# Patient Record
Sex: Male | Born: 1995 | Hispanic: Yes | Marital: Single | Smoking: Never smoker
Health system: Southern US, Community
[De-identification: ages and names within clinical notes are randomized; demographics above are authoritative.]

## PROBLEM LIST (undated history)

## (undated) HISTORY — PX: TONSILLECTOMY AND ADENOIDECTOMY: SUR1326

---
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
## 2019-03-21 ENCOUNTER — Other Ambulatory Visit: Payer: Self-pay

## 2019-03-21 DIAGNOSIS — Z20822 Contact with and (suspected) exposure to covid-19: Secondary | ICD-10-CM

## 2019-03-22 LAB — NOVEL CORONAVIRUS, NAA: SARS-CoV-2, NAA: NOT DETECTED

## 2019-06-08 ENCOUNTER — Encounter: Payer: Self-pay | Admitting: Emergency Medicine

## 2019-06-08 ENCOUNTER — Ambulatory Visit
Admission: EM | Admit: 2019-06-08 | Discharge: 2019-06-08 | Disposition: A | Discharge status: Home / Self Care | Payer: Worker's Compensation | Attending: Family Medicine | Admitting: Family Medicine

## 2019-06-08 ENCOUNTER — Other Ambulatory Visit: Payer: Self-pay

## 2019-06-08 ENCOUNTER — Ambulatory Visit (INDEPENDENT_AMBULATORY_CARE_PROVIDER_SITE_OTHER): Payer: Worker's Compensation

## 2019-06-08 DIAGNOSIS — S9001XA Contusion of right ankle, initial encounter: Secondary | ICD-10-CM

## 2019-06-08 DIAGNOSIS — M25571 Pain in right ankle and joints of right foot: Secondary | ICD-10-CM

## 2019-06-08 DIAGNOSIS — S99911A Unspecified injury of right ankle, initial encounter: Secondary | ICD-10-CM

## 2019-06-08 DIAGNOSIS — W228XXA Striking against or struck by other objects, initial encounter: Secondary | ICD-10-CM | POA: Diagnosis not present

## 2019-06-08 MED ORDER — MELOXICAM 15 MG PO TABS: 15.0000 mg | ORAL_TABLET | Freq: Every day | ORAL | 0 refills | Status: DC | PRN

## 2019-06-08 NOTE — Discharge Instructions (Signed)
Medication as directed.  Continue icing.  Take care  Dr. Lacinda Axon

## 2019-06-08 NOTE — ED Triage Notes (Signed)
Patient states that he was injured at work on Sunday.  Patient states that he went back to work today.  Patient c/o right foot and ankle pain.  Patient states that on Sunday another employee's pallot hit his right ankle.

## 2019-06-08 NOTE — ED Provider Notes (Signed)
[facility]    CSN: [id number] Arrival date & time: 06/08/19  [place]  History   Chief Complaint Chief Complaint  Patient presents with  . Worker's Comp  . Foot Pain    HPI  23 year old male presents with an ankle injury.  Patient states that he works at [employer].  Patient states that a forklift was carrying pallets and he was accidentally hit by one of the pallets.  He was hit on the medial aspect of his right ankle.  Patient states that this occurred on Sunday.  He has been out of work until today.  He returned today and had pain doing his normal job activities.  He rates his pain as 6/10 in severity.  He is essentially asymptomatic until he is active and then he has pain.  No swelling.  He has a small eschar on the medial aspect of his ankle.  No evidence of infection.  No fever.  Patient has been using Tylenol and has rested as well as ice the area with improvement.  However, he was concerned given worsening pain and difficulty doing his job today.  No other complaints.  Hx reviewed as below.  No significant PMH other than what pertains to surgical Hx.  Past Surgical History:  Procedure Laterality Date  . TONSILLECTOMY AND ADENOIDECTOMY     Home Medications    Prior to Admission medications   Medication Sig Start Date End Date Taking? Authorizing Provider  meloxicam (MOBIC) 15 MG tablet Take 1 tablet (15 mg total) by mouth daily as needed for pain. 06/08/19   Coral Spikes, DO    Family History Family History  Problem Relation Age of Onset  . Healthy Mother   . Hypertension Father     Social History Social History   Tobacco Use  . Smoking status: Never Smoker  . Smokeless tobacco: Never Used  Substance Use Topics  . Alcohol use: Yes  . Drug use: Never     Allergies   Patient has no known allergies.   Review of Systems Review of Systems  Constitutional: Negative.   Musculoskeletal:       Ankle pain.   Physical Exam  Triage Vital Signs ED Triage Vitals  Enc Vitals Group     BP 06/08/19 1852 139/67     Pulse --      Resp 06/08/19 1852 16     Temp 06/08/19 1852 98.4 F (36.9 C)     Temp Source 06/08/19 1852 Oral     SpO2 06/08/19 1852 100 %     Weight 06/08/19 1850 180 lb (81.6 kg)     Height 06/08/19 1850 6\' 6"  (1.981 m)     Head Circumference --      Peak Flow --      Pain Score 06/08/19 1849 6     Pain Loc --      Pain Edu? --      Excl. in [place]? --    Updated Vital Signs BP 139/67 (BP Location: Right Arm)   Temp 98.4 F (36.9 C) (Oral)   Resp 16   Ht 6\' 6"  (1.981 m)   Wt 81.6 kg   SpO2 100%   BMI 20.80 kg/m   Visual Acuity Right Eye Distance:   Left Eye Distance:   Bilateral Distance:    Right Eye Near:   Left Eye Near:    Bilateral Near:     Physical Exam Vitals signs and nursing note reviewed.  Constitutional:      General: He is not in acute distress.    Appearance: Normal appearance.  HENT:     Head: Normocephalic and atraumatic.  Eyes:     General:        Right eye: No discharge.        Left eye: No discharge.     Conjunctiva/sclera: Conjunctivae normal.  Pulmonary:     Effort: Pulmonary effort is normal. No respiratory distress.  Musculoskeletal:     Comments: Right ankle -patient with mild tenderness just inferior to the medial malleolus.  Normal range of motion.  Achilles intact.  No apparent swelling.  Skin:    Findings: No bruising.     Comments: Small eschar noted on the medial aspect of his right ankle.  Neurological:     Mental Status: He is alert.  Psychiatric:        Mood and Affect: Mood normal.        Behavior: Behavior normal.    UC Treatments / Results  Labs (all labs ordered are listed, but only abnormal results are displayed) Labs Reviewed - No data to display  EKG   Radiology Dg Ankle Complete Right  Result Date: 06/08/2019 CLINICAL DATA:  Injury, direct medial ankle strike on Sunday, unable to bear weight EXAM: RIGHT ANKLE -  COMPLETE 3+ VIEW COMPARISON:  None. FINDINGS: Minimal medial ankle soft tissue swelling. No joint effusion. No acute bony abnormality. Specifically, no fracture, subluxation, or dislocation. Ankle mortise is congruent. Normal bone mineralization. IMPRESSION: Minimal soft tissue swelling medial ankle. No joint effusion or acute osseous abnormality. Electronically Signed   By: Kreg Shropshire M.D.   On: 06/08/2019 19:31    Procedures Procedures (including critical care time)  Medications Ordered in UC Medications - No data to display  Initial Impression / Assessment and Plan / UC Course  I have reviewed the triage vital signs and the nursing notes.  Pertinent labs & imaging results that were available during my care of the patient were reviewed by me and considered in my medical decision making (see chart for details).    23 year old male presents with ankle injury.  Workmen's Comp. injury.  X-ray negative.  Placing on meloxicam.  Advised rest and ice.  Able to return to work with restrictions.  Forms filled out.  Final Clinical Impressions(s) / UC Diagnoses   Final diagnoses:  Contusion of right ankle, initial encounter  Injury of right ankle, initial encounter     Discharge Instructions     Medication as directed.  Continue icing.  Take care  Dr. Adriana Simas     ED Prescriptions    Medication Sig Dispense Auth. Provider   meloxicam (MOBIC) 15 MG tablet Take 1 tablet (15 mg total) by mouth daily as needed for pain. 14 tablet Tommie Sams, DO     PDMP not reviewed this encounter.   Tommie Sams, [place] 06/08/19 1945

## 2020-07-27 ENCOUNTER — Ambulatory Visit (HOSPITAL_COMMUNITY)
Admission: EM | Admit: 2020-07-27 | Discharge: 2020-07-27 | Disposition: A | Discharge status: Still In Hospital | Payer: Medicaid Other

## 2020-07-27 ENCOUNTER — Other Ambulatory Visit: Payer: Self-pay

## 2020-07-27 ENCOUNTER — Ambulatory Visit
Admission: EM | Admit: 2020-07-27 | Discharge: 2020-07-27 | Disposition: A | Discharge status: Home / Self Care | Payer: Medicaid Other | Attending: Family Medicine | Admitting: Family Medicine

## 2020-07-27 DIAGNOSIS — M545 Low back pain, unspecified: Secondary | ICD-10-CM

## 2020-07-27 MED ORDER — CYCLOBENZAPRINE HCL 10 MG PO TABS
10.0000 mg | ORAL_TABLET | Freq: Two times a day (BID) | ORAL | 0 refills | Status: AC | PRN
Start: 2020-07-27 — End: ?

## 2020-07-27 MED ORDER — PREDNISONE 5 MG PO TABS: ORAL_TABLET | ORAL | 0 refills | Status: DC

## 2020-07-27 NOTE — ED Provider Notes (Signed)
[facility]    CSN: [id number] Arrival date & time: 07/27/20  1555      History   Chief Complaint Chief Complaint  Patient presents with  . Back Pain    X 4 years    HPI Dwayne Green is a 24 y.o. male.   He is presenting with acute on chronic right-sided low back pain.  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  He denies any specific injury or inciting event.  Symptoms are localized to the right lower area.  No history of surgery.  No history of injury.  HPI  History reviewed. No pertinent past medical history.  There are no problems to display for this patient.   Past Surgical History:  Procedure Laterality Date  . TONSILLECTOMY AND ADENOIDECTOMY         Home Medications    Prior to Admission medications   Medication Sig Start Date End Date Taking? Authorizing Provider  cyclobenzaprine (FLEXERIL) 10 MG tablet Take 1 tablet (10 mg total) by mouth 2 (two) times daily as needed for muscle spasms. 07/27/20   Myra Rude, MD  predniSONE (DELTASONE) 5 MG tablet Take 6 pills for first day, 5 pills second day, 4 pills third day, 3 pills fourth day, 2 pills the fifth day, and 1 pill sixth day. 07/27/20   Myra Rude, MD    Family History Family History  Problem Relation Age of Onset  . Healthy Mother   . Hypertension Father     Social History Social History   Tobacco Use  . Smoking status: Never Smoker  . Smokeless tobacco: Never Used  Vaping Use  . Vaping Use: Some days  Substance Use Topics  . Alcohol use: Yes  . Drug use: Never     Allergies   Patient has no known allergies.   Review of Systems Review of Systems  See HPI  Physical Exam Triage Vital Signs ED Triage Vitals  Enc Vitals Group     BP 07/27/20 1635 (!) 146/78     Pulse Rate 07/27/20 1635 (!) 110     Resp 07/27/20 1635 20     Temp 07/27/20 1635 99.2 F (37.3 C)     Temp Source 07/27/20 1635 Oral     SpO2 07/27/20 1635 98 %     Weight  --      Height --      Head Circumference --      Peak Flow --      Pain Score 07/27/20 1646 8     Pain Loc --      Pain Edu? --      Excl. in GC? --    No data found.  Updated Vital Signs BP (!) 146/78 (BP Location: Right Arm)   Pulse (!) 110   Temp 99.2 F (37.3 C) (Oral)   Resp 20   SpO2 98%   Visual Acuity Right Eye Distance:   Left Eye Distance:   Bilateral Distance:    Right Eye Near:   Left Eye Near:    Bilateral Near:     Physical Exam Gen: NAD, alert, cooperative with exam, well-appearing ENT: normal lips, normal nasal mucosa,  Eye: normal EOM, normal conjunctiva and lids Skin: no rashes, no areas of induration  Neuro: normal tone, normal sensation to touch Psych:  normal insight, alert and oriented MSK:  Back:  Normal flexion and extension. No instability 1 leg standing. Some instability with left hip flexion  and abduction. Negative straight leg raise. Neurovascular intact   UC Treatments / Results  Labs (all labs ordered are listed, but only abnormal results are displayed) Labs Reviewed - No data to display  EKG   Radiology No results found.  Procedures Procedures (including critical care time)  Medications Ordered in UC Medications - No data to display  Initial Impression / Assessment and Plan / UC Course  I have reviewed the triage vital signs and the nursing notes.  Pertinent labs & imaging results that were available during my care of the patient were reviewed by me and considered in my medical decision making (see chart for details).     Mr. Eckerman is a 24 year old male that is presenting with acute on chronic right-sided low back pain.  Seems likely related to spasm.  He may have an underlying Marfan's syndrome diagnosis.  Which could be contributing to his symptoms.  Provided prednisone and Flexeril.  Counseled on follow-up.  Final Clinical Impressions(s) / UC Diagnoses   Final diagnoses:  Acute right-sided low back pain without  sciatica     Discharge Instructions     Please try heat  Please try the medicines.  Please follow up if your symptoms fail to improve.     ED Prescriptions    Medication Sig Dispense Auth. Provider   predniSONE (DELTASONE) 5 MG tablet Take 6 pills for first day, 5 pills second day, 4 pills third day, 3 pills fourth day, 2 pills the fifth day, and 1 pill sixth day. 21 tablet Myra Rude, MD   cyclobenzaprine (FLEXERIL) 10 MG tablet Take 1 tablet (10 mg total) by mouth 2 (two) times daily as needed for muscle spasms. 20 tablet Myra Rude, MD     PDMP not reviewed this encounter.   Myra Rude, MD 07/27/20 [phone]

## 2020-07-27 NOTE — Discharge Instructions (Signed)
Please try heat  Please try the medicines.  Please follow up if your symptoms fail to improve.

## 2020-07-27 NOTE — ED Triage Notes (Signed)
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 Pt is aox4 and ambulatory. Pt saw a chiropractor 2 years ago but stopped getting treatment.

## 2020-07-29 ENCOUNTER — Ambulatory Visit (HOSPITAL_BASED_OUTPATIENT_CLINIC_OR_DEPARTMENT_OTHER)
Admission: RE | Admit: 2020-07-29 | Discharge: 2020-07-29 | Disposition: A | Discharge status: Home / Self Care | Payer: Medicaid Other | Source: Ambulatory Visit | Attending: Family Medicine | Admitting: Family Medicine

## 2020-07-29 ENCOUNTER — Ambulatory Visit (INDEPENDENT_AMBULATORY_CARE_PROVIDER_SITE_OTHER): Payer: Medicaid Other | Admitting: Family Medicine

## 2020-07-29 ENCOUNTER — Other Ambulatory Visit: Payer: Self-pay

## 2020-07-29 VITALS — BP 124/70 | Ht 78.0 in | Wt 189.0 lb

## 2020-07-29 DIAGNOSIS — R011 Cardiac murmur, unspecified: Secondary | ICD-10-CM | POA: Diagnosis not present

## 2020-07-29 DIAGNOSIS — M545 Low back pain, unspecified: Secondary | ICD-10-CM | POA: Diagnosis present

## 2020-07-29 DIAGNOSIS — R2991 Unspecified symptoms and signs involving the musculoskeletal system: Secondary | ICD-10-CM

## 2020-07-29 NOTE — Assessment & Plan Note (Addendum)
Acute on chronic in nature. Seems more muscle in nature. Has been started on prednisone and Flexeril. He has normal marfanoid habitus which could be contributing to his ongoing symptoms. He played baseball while growing up in the [place] at [employer]. 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 - counseled on home exercise and supportive care - xray  - provided duexis samples.  - referral to physical therapy

## 2020-07-29 NOTE — Patient Instructions (Addendum)
Good to see you  Please try heat  Please try the duexis  Please try the exercises  Physical therapy will give you call  The eye doctor appointment will be scheduled:  [facility] [address] [place], [address] Tues 09/16/2020 at 2p Phone: [phone]  I will call with the results  Please send me a message in MyChart with any questions or updates.  Please see me back in 4-6 weeks.   --Dr. Jordan Likes

## 2020-07-29 NOTE — Progress Notes (Signed)
Medication Samples have been provided to the patient.  Drug name: duexis      Strength: 800/26.6mg         Qty: 2 bxs  LOT: [id number]  Exp.Date: 12/07/2020  Dosing instructions: take one tab by mouth three time a day  The patient has been instructed regarding the correct time, dose, and frequency of taking this medication, including desired effects and most common side effects.   Dwayne Green 1:47 PM 07/29/2020

## 2020-07-29 NOTE — Progress Notes (Signed)
  Dwayne Green - 24 y.o. male MRN [id number]  Date of birth: [birth year]  SUBJECTIVE:  Including CC & ROS.  No chief complaint on file.   Dwayne Green is a 24 y.o. male that is presenting with acute on chronic right-sided low back pain. Pain is worse when he gets up out of a seated position. Seems localized to the lower back. He has noticed this roughly 4 years now. 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 No radicular symptoms. Has tried some medications with limited improvement. Denies any specific injury or inciting event. Seems to be staying the same.   Review of Systems See HPI   HISTORY: Past Medical, Surgical, Social, and Family History Reviewed & Updated per EMR.   Pertinent Historical Findings include:  No past medical history on file.  Past Surgical History:  Procedure Laterality Date  . TONSILLECTOMY AND ADENOIDECTOMY      Family History  Problem Relation Age of Onset  . Healthy Mother   . Hypertension Father     Social History   Socioeconomic History  . Marital status: Single    Spouse name: Not on file  . Number of children: Not on file  . Years of education: Not on file  . Highest education level: Not on file  Occupational History  . Not on file  Tobacco Use  . Smoking status: Never Smoker  . Smokeless tobacco: Never Used  Vaping Use  . Vaping Use: Some days  Substance and Sexual Activity  . Alcohol use: Yes  . Drug use: Never  . Sexual activity: Yes  Other Topics Concern  . Not on file  Social History Narrative  . Not on file   Social Determinants of Health   Financial Resource Strain: Not on file  Food Insecurity: Not on file  Transportation Needs: Not on file  Physical Activity: Not on file  Stress: Not on file  Social Connections: Not on file  Intimate Partner Violence: Not on file     PHYSICAL EXAM:  VS: BP 124/70   Ht 6\' 6"  (1.981 m)   Wt 189 lb (85.7 kg)   BMI 21.84 kg/m  Physical Exam Gen: NAD, alert,  cooperative with exam, well-appearing MSK:  Normal range of motion in flexion and extension. Normal strength resistance. Positive wrist sign and thumb sign. Neurovascular intact     ASSESSMENT & PLAN:   Acute right-sided low back pain without sciatica Acute on chronic in nature. Seems more muscle in nature. Has been started on prednisone and Flexeril. He has normal marfanoid habitus which could be contributing to his ongoing symptoms. He played baseball while growing up in the at [place]. 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 No other family member is following like he is. -Referral to ophthalmology. -Echo. -Referral to physical therapy.

## 2020-07-29 NOTE — Assessment & Plan Note (Signed)
Has more symptoms suggestive of Marfan's in nature. No other family member is following like he is. -Referral to ophthalmology. -Echo. -Referral to physical therapy.

## 2020-07-30 ENCOUNTER — Telehealth: Payer: Self-pay | Admitting: Family Medicine

## 2020-07-30 NOTE — Telephone Encounter (Signed)
Left VM for patient. If he calls back please have him speak with a nurse/CMA and inform that his xray shows that he has a mild curve to his spine. This is what physical therapy can help with.   If any questions then please take the best time and phone number to call and I will try to call him back.   Myra Rude, MD [facility] 07/30/2020, 8:52 AM

## 2020-07-31 ENCOUNTER — Ambulatory Visit (HOSPITAL_COMMUNITY): Payer: Medicaid Other

## 2020-07-31 NOTE — Addendum Note (Signed)
Addended by: Annita Brod on: 07/31/2020 11:50 AM   Modules accepted: Orders

## 2020-08-19 ENCOUNTER — Ambulatory Visit: Payer: Medicaid Other | Admitting: Cardiology

## 2020-08-19 ENCOUNTER — Ambulatory Visit: Payer: Medicaid Other

## 2020-08-29 ENCOUNTER — Telehealth: Payer: Medicaid Other | Admitting: Family Medicine

## 2020-09-04 ENCOUNTER — Ambulatory Visit: Payer: Medicaid Other

## 2020-09-09 ENCOUNTER — Ambulatory Visit: Payer: Medicaid Other | Admitting: Cardiology

## 2020-09-12 ENCOUNTER — Ambulatory Visit: Payer: Medicaid Other | Admitting: Family Medicine

## 2020-09-15 ENCOUNTER — Ambulatory Visit (INDEPENDENT_AMBULATORY_CARE_PROVIDER_SITE_OTHER): Payer: Medicaid Other | Admitting: Internal Medicine

## 2020-09-15 ENCOUNTER — Other Ambulatory Visit: Payer: Self-pay

## 2020-09-15 VITALS — BP 134/92 | HR 91 | Ht 78.0 in | Wt 190.0 lb

## 2020-09-15 DIAGNOSIS — R079 Chest pain, unspecified: Secondary | ICD-10-CM

## 2020-09-15 DIAGNOSIS — R2991 Unspecified symptoms and signs involving the musculoskeletal system: Secondary | ICD-10-CM

## 2020-09-15 NOTE — Progress Notes (Signed)
Cardiology Office Note:    Date:  09/15/2020   ID:  Dwayne Green, DOB 1996-02-10, MRN [id number]  PCP:  [facility]  [facility] Cardiologist:  Armanda Magic, MD  [facility] Electrophysiologist:  None   CC: Back pain Consulted for the evaluation of heart murmur at the behest of [facility]  History of Present Illness:    Dwayne Green is a 25 y.o. male with a hx of marfanoid habitus who presents for evaluation.  Patient notes that he is feeling OK- but got evaluated for back pain.  Notes that he has sternal chest pain when he breathes in.  Worse with inspiration and goes to his back.    Discomfort occurs with inspiratory only, worsens with breathing, and improves with breathing out.  Patient exertion notable for hiking and exercise with pain after activity but not during. No shortness of breath, DOE.  No PND or orthopnea.  No bendopnea, weight gain, leg swelling , or abdominal swelling.  No syncope or near syncope . Notes  no palpitations or funny heart beats.     Patient reports NO prior cardiac testing including  echo,  stress test,  heart catheterizations,  cardioversion,  ablations.  No past medical history on file.  Past Surgical History:  Procedure Laterality Date  . TONSILLECTOMY AND ADENOIDECTOMY      Current Medications: No outpatient medications have been marked as taking for the 09/15/20 encounter (Office Visit) with Christell Constant, MD.     Allergies:   Patient has no known allergies.   Social History   Socioeconomic History  . Marital status: Single    Spouse name: Not on file  . Number of children: Not on file  . Years of education: Not on file  . Highest education level: Not on file  Occupational History  . Not on file  Tobacco Use  . Smoking status: Never Smoker  . Smokeless tobacco: Never Used  Vaping Use  . Vaping Use: Some days  Substance and Sexual Activity  . Alcohol use: Yes  .  Drug use: Never  . Sexual activity: Yes  Other Topics Concern  . Not on file  Social History Narrative  . Not on file   Social Determinants of Health   Financial Resource Strain: Not on file  Food Insecurity: Not on file  Transportation Needs: Not on file  Physical Activity: Not on file  Stress: Not on file  Social Connections: Not on file     Family History: The patient's family history includes Healthy in his mother; Hypertension in his father. History of coronary artery disease notable for no members. History of heart failure notable for no members. No history of cardiomyopathies including hypertrophic cardiomyopathy, left ventricular non-compaction, or arrhythmogenic right ventricular cardiomyopathy. History of arrhythmia notable for no members. Denies family history of sudden cardiac death including drowning, car accidents, or unexplained deaths in the family. No history of bicuspid aortic valve or aortic aneurysm or dissection. No history of Marfan's  ROS:   Please see the history of present illness.    Notes chronic back pain that kept him from baseball  All other systems reviewed and are negative.  EKGs/Labs/Other Studies Reviewed:    The following studies were reviewed today:  EKG:   09/15/20: SR rate 91 WNL  Recent Labs: No results found for requested labs within last 8760 hours.  Recent Lipid Panel No results found for: CHOL, TRIG, HDL, CHOLHDL, VLDL,  LDLCALC, LDLDIRECT   Risk Assessment/Calculations:     Ghent Criteria/Systemic Score: +2   Physical Exam:    VS:  BP (!) 134/92   Pulse 91   Ht 6\' 6"  (1.981 m)   Wt 190 lb (86.2 kg)   BMI 21.96 kg/m     Arm span: 82 cm Lower segment 125 cm  Wt Readings from Last 3 Encounters:  09/15/20 190 lb (86.2 kg)  07/29/20 189 lb (85.7 kg)  06/08/19 180 lb (81.6 kg)     GEN:  Well nourished, well developed in no acute distress HEENT: Normal, no myopia NECK: No JVD; No carotid bruits LYMPHATICS: No  lymphadenopathy CARDIAC: RRR, no murmurs, rubs, gallops RESPIRATORY:  Clear to auscultation without rales, wheezing or rhonchi  ABDOMEN: Soft, non-tender, non-distended MUSCULOSKELETAL:  No edema; No deformity of hind foot or flat foot, hand sign negative; wrist sign positive, no scoliosis, pes escavatum SKIN: Warm and dry, no skin striae NEUROLOGIC:  Alert and oriented x 3 PSYCHIATRIC:  Normal affect   ASSESSMENT:    1. Chest pain of uncertain etiology   2. Marfanoid habitus    PLAN:    In order of problems listed above:  Atypical Chest Pain Marfanoid Habitus with Ghent Score of 2 - will get CXR PA/Lateral and Echocardiogram  Two to three month follow up unless new symptoms or abnormal test results warranting change in plan  Would be reasonable for  Video Visit Follow up Would be reasonable for  APP Follow up  Medication Adjustments/Labs and Tests Ordered: Current medicines are reviewed at length with the patient today.  Concerns regarding medicines are outlined above.  Orders Placed This Encounter  Procedures  . DG Chest 2 View  . EKG 12-Lead  . ECHOCARDIOGRAM COMPLETE   No orders of the defined types were placed in this encounter.   Patient Instructions  Medication Instructions:  Your physician recommends that you continue on your current medications as directed. Please refer to the Current Medication list given to you today.  *If you need a refill on your cardiac medications before your next appointment, please call your pharmacy*   Lab Work: NONE If you have labs (blood work) drawn today and your tests are completely normal, you will receive your results only by: 06/10/19 MyChart Message (if you have MyChart) OR . A paper copy in the mail If you have any lab test that is abnormal or we need to change your treatment, we will call you to review the results.   Testing/Procedures: Your physician has requested that you have an echocardiogram. Echocardiography is a  painless test that uses sound waves to create images of your heart. It provides your doctor with information about the size and shape of your heart and how well your heart's chambers and valves are working. This procedure takes approximately one hour. There are no restrictions for this procedure.   Your provider recommends you have a CHEST X-RAY. Please proceed to [facility]: Address: [address] Phone: [phone] You do not need an appointment. Business hours are M-F 8:00AM to 5:00PM. There are no restrictions for this test.      Follow-Up: At [facility], you and your health needs are our priority.  As part of our continuing mission to provide you with exceptional heart care, we have created designated Provider Care Teams.  These Care Teams include your primary Cardiologist (physician) and Advanced Practice Providers (APPs -  Physician Assistants and  Nurse Practitioners) who all work together to provide you with the care you need, when you need it.  We recommend signing up for the patient portal called "MyChart".  Sign up information is provided on this After Visit Summary.  MyChart is used to connect with patients for Virtual Visits (Telemedicine).  Patients are able to view lab/test results, encounter notes, upcoming appointments, etc.  Non-urgent messages can be sent to your provider as well.   To learn more about what you can do with MyChart, go to [email or web address].    Your next appointment:   3 month(s)  The format for your next appointment:   Video Visit  Provider:  Izora Ribas, MD         Signed, Christell Constant, MD  09/15/2020 5:26 PM    [facility]

## 2020-09-15 NOTE — Patient Instructions (Signed)
Medication Instructions:  Your physician recommends that you continue on your current medications as directed. Please refer to the Current Medication list given to you today.  *If you need a refill on your cardiac medications before your next appointment, please call your pharmacy*   Lab Work: NONE If you have labs (blood work) drawn today and your tests are completely normal, you will receive your results only by: Marland Kitchen MyChart Message (if you have MyChart) OR . A paper copy in the mail If you have any lab test that is abnormal or we need to change your treatment, we will call you to review the results.   Testing/Procedures: Your physician has requested that you have an echocardiogram. Echocardiography is a painless test that uses sound waves to create images of your heart. It provides your doctor with information about the size and shape of your heart and how well your heart's chambers and valves are working. This procedure takes approximately one hour. There are no restrictions for this procedure.   Your provider recommends you have a CHEST X-RAY. Please proceed to [facility] at [facility]: Address: [address] Phone: [phone] You do not need an appointment. Business hours are M-F 8:00AM to 5:00PM. There are no restrictions for this test.      Follow-Up: At [facility], you and your health needs are our priority.  As part of our continuing mission to provide you with exceptional heart care, we have created designated Provider Care Teams.  These Care Teams include your primary Cardiologist (physician) and Advanced Practice Providers (APPs -  Physician Assistants and Nurse Practitioners) who all work together to provide you with the care you need, when you need it.  We recommend signing up for the patient portal called "MyChart".  Sign up information is provided on this After Visit Summary.  MyChart is used to connect with patients for Virtual  Visits (Telemedicine).  Patients are able to view lab/test results, encounter notes, upcoming appointments, etc.  Non-urgent messages can be sent to your provider as well.   To learn more about what you can do with MyChart, go to [email or web address].    Your next appointment:   3 month(s)  The format for your next appointment:   Video Visit  Provider:  Izora Ribas, MD

## 2020-09-16 ENCOUNTER — Ambulatory Visit: Payer: Medicaid Other | Attending: Family Medicine

## 2020-09-16 DIAGNOSIS — R293 Abnormal posture: Secondary | ICD-10-CM | POA: Diagnosis present

## 2020-09-16 DIAGNOSIS — M62830 Muscle spasm of back: Secondary | ICD-10-CM | POA: Diagnosis present

## 2020-09-16 DIAGNOSIS — R2689 Other abnormalities of gait and mobility: Secondary | ICD-10-CM | POA: Diagnosis present

## 2020-09-16 DIAGNOSIS — M545 Low back pain, unspecified: Secondary | ICD-10-CM | POA: Insufficient documentation

## 2020-09-16 DIAGNOSIS — G8929 Other chronic pain: Secondary | ICD-10-CM | POA: Insufficient documentation

## 2020-09-17 NOTE — Therapy (Addendum)
[facility] [address] [place], [place], [address] Phone: [phone]   Fax:  [phone]  Physical Therapy Evaluation  Patient Details  Name: Dwayne Green MRN: [id number] Date of Birth: 06/07/1996 Referring Provider (PT): Myra Rude, MD   Encounter Date: 09/16/2020   PT End of Session - 09/16/20 1705    Visit Number 1    Number of Visits 9    Date for PT Re-Evaluation 11/15/20    Authorization Type [place] [employer] MCD    PT Start Time 1705    PT Stop Time 1748    PT Time Calculation (min) 43 min    Activity Tolerance Patient tolerated treatment well    Behavior During Therapy [facility] for tasks assessed/performed           History reviewed. No pertinent past medical history.  Past Surgical History:  Procedure Laterality Date  . TONSILLECTOMY AND ADENOIDECTOMY      There were no vitals filed for this visit.    Subjective Assessment - 09/16/20 1708    Subjective "I played baseball as a pitcher and went to the [place] to train and get signed. One day (about 5 years ago) my back started like crazy, and they said it might have been from me growing so far or maybe from lifting. 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 It's not as back as it was, but my back always hurts."    Limitations Sitting;Standing;Walking;House hold activities    How long can you sit comfortably? 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 "Probably 30-45 minutes"    How long can you walk comfortably? "Probably 30-45 minutes" then sit for a break if I'm walking long distances    Diagnostic tests 07/29/2020 Lumbar spine radiograph: No acute osseous abnormality in the lumbar spine. Mild levocurvature of the thoracolumbar junction    Patient Stated Goals "I want to  just be back in the gym. I lost a lot of weight because the back limits me."    Currently in Pain? Yes    Pain Score 3     Pain Location Back    Pain Orientation Left;Lower    Pain Descriptors / Indicators Pressure    Pain Type Chronic pain    Pain Radiating Towards Radicular pain to each LE (not typically both at the same time but has experienced it in right and left LE)    Pain Onset More than a month ago    Pain Frequency Intermittent    Aggravating Factors  Prolonged positions (walking, standing, sitting), lying down (unable to lie down on back). 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[employer])    Leisure Working out as able, hang out with family, go out to eat      Cognition   Overall Cognitive Status Within Functional Limits for tasks assessed      Observation/Other Assessments   Focus on Therapeutic Outcomes (FOTO)  No FOTO - MCD      Sensation   Additional Comments Increased tingling along R lateral distal thigh      AROM   Lumbar Flexion WFL - mid LBP; fingertips to mid shin    Lumbar Extension WFL - mid LBP    Lumbar - Right Side Bend WFL    Lumbar - Left Side Bend WFL - sharp pain in R low back    Lumbar - Right Rotation WFL no pain    Lumbar - Left Rotation WFL no pain      Strength   Right Hip Flexion 4-/5    Right Hip ABduction 4/5    Right Hip ADduction 4/5    Left Hip Flexion 4-/5    Left Hip ABduction 4/5    Left Hip ADduction 4/5    Right Knee Flexion 4/5    Right Knee Extension 4/5    Left Knee Flexion 4/5    Left Knee Extension 4/5    Right Ankle Dorsiflexion 5/5    Right Ankle Plantar Flexion 5/5   modified test in sitting   Left Ankle Dorsiflexion 5/5    Left Ankle Plantar Flexion 5/5   modified test in sitting     Palpation   Palpation comment TTP along R lower thoracic and lumbar paraspinals and QL                      Objective measurements completed on examination: See above findings.       [facility] Adult PT Treatment/Exercise - 09/17/20 0001      Exercises   Exercises Lumbar      Lumbar Exercises: Stretches   Single Knee to Chest Stretch Right;Left;1 rep;30 seconds    Lower Trunk Rotation 5 reps    Lower Trunk Rotation Limitations 5x each direction    Piriformis Stretch Right;Left;1 rep;30 seconds    Other Lumbar Stretch Exercise Child's pose forward and with lateral bias x 20 sec each                  PT Education - 09/17/20  1207    Education Details HEP, anatomy of condition, posture and use of lumbar  roll when sitting (especially while truck driving), performing stretches before and after shifts as able, POC    Person(s) Educated Patient    Methods Explanation;Demonstration;Tactile cues;Verbal cues;Handout    Comprehension Verbalized understanding;Returned demonstration            PT Short Term Goals - 09/17/20 1407      PT SHORT TERM GOAL #1   Title Pt will be independent with initial HEP.    Baseline Provided initial HEP 09/16/2020.    Time 4    Period Weeks    Status New    Target Date 10/15/20      PT SHORT TERM GOAL #2   Title Pt will increase bilateral hip FL to at least 4/5 upon MMT reassessment.    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    Baseline Pt has not recently exercised in gym but wishes to return to workout routine and lifting without back pain    Time 4    Period Weeks    Status New    Target Date 10/15/20             PT Long Term Goals - 09/17/20 1412      PT LONG TERM GOAL #1   Title Pt will be independent with advanced HEP.    Baseline Pt provided initial HEP at evaluation 09/16/2020    Time 8    Period Weeks    Status New    Target Date 11/12/20      PT LONG TERM GOAL #2   Title Pt will increase BLE MMT to 5/5 grossly    Baseline 4/5 grossly with 4-/5 B hip FL (see flowsheet)    Time 8    Period Weeks    Status New    Target Date 11/12/20      PT LONG TERM GOAL #3   Title Pt will  report equal sensation with light touch reassessment along bilateral lateral femoral cutaneous nerve distribution/lateral thigh    Baseline Numbness and decreased sensation in lateral R thigh    Time 8    Period Weeks    Status New    Target Date 11/12/20      PT LONG TERM GOAL #4   Title Patient will be able to tolerate working his regular amount of truckdriving shifts with </= 2/10 LBP throughout work day    Baseline Discomfort after 30-45 minutes and currently decreased frequency of shifts due to symptom exacerbation    Time 8    Period Weeks    Status New    Target Date 11/12/20  Plan - 09/17/20 1352    Clinical Impression Statement Patient is a 25 year old male who presents to OPPT with complaints of right-sided LBP with occasional radicular symptoms that he has experienced in each LE. He denies radicular symptoms during evaluation but has diminished sensation and "numbness" along R lateral femoral cutaneous nerve distribution with light touch assessment. He has TTP along R lower thoracic and lumbar paraspinals and quadratus lumborum. He has decreased BLE strength with greatest deficits noted with resisted hip FL bilaterally. Imaging 07/29/2020 revealed mild levocurvature of TLJ and minimal retrolisthesis of L4-L5. He presents with decreased hamstring flexibility bilaterally and TTP along lumbar SP with PAIVM assessment. He should benefit from skilled PT intervention to address deficits and improve postural awareness, especially while performing household activities and while truckdriving.    Personal Factors and Comorbidities Profession;Past/Current Experience;Time since onset of injury/illness/exacerbation    Examination-Activity Limitations Sit;Sleep;Squat;Stairs;Stand;Locomotion Level;Lift;Bend;Carry    Examination-Participation Restrictions Cleaning;Community Activity;Driving;Meal Prep;Occupation;Shop    Stability/Clinical Decision Making Evolving/Moderate  complexity    Clinical Decision Making Moderate    Rehab Potential Good    PT Frequency 1x / week    PT Duration 8 weeks    PT Treatment/Interventions ADLs/Self Care Home Management;Aquatic Therapy;Electrical Stimulation;Cryotherapy;Iontophoresis 4mg /ml Dexamethasone;Moist Heat;Traction;Neuromuscular re-education;Balance training;Therapeutic exercise;Therapeutic activities;Functional mobility training;Stair training;Gait training;Patient/family education;Manual techniques;Energy conservation;Dry needling;Passive range of motion;Taping    PT Next Visit Plan Further assess TLJ/lumbar spine; stretch and strengthen to address tightness in R paraspinals and QL from mild levocurvature, core/trunk/hip strengthening. Body mechanics and postural awareness    PT Home Exercise Plan KB[employer]HVLLD - SKTC, LTR, child's pose, piriformis stretch    Consulted and Agree with Plan of Care Patient           Patient will benefit from skilled therapeutic intervention in order to improve the following deficits and impairments:  Decreased activity tolerance,Decreased strength,Decreased endurance,Decreased range of motion,Difficulty walking,Increased muscle spasms,Postural dysfunction,Improper body mechanics,Pain,Impaired sensation,Decreased mobility  Visit Diagnosis: Chronic right-sided low back pain, unspecified whether sciatica present  Muscle spasm of back  Abnormal posture  Other abnormalities of gait and mobility     Problem List Patient Active Problem List   Diagnosis Date Noted  . Chest pain of uncertain etiology 09/15/2020  . Acute right-sided low back pain without sciatica 07/29/2020  . 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    Check all possible CPT codes: 42683- Therapeutic Exercise, [phone]- Neuro Re-education, [phone] - Gait Training, [phone] - Manual Therapy, 97530 - Therapeutic Activities, [phone] - Self Care, [id number] - Mechanical traction, 97014 - Electrical stimulation (unattended), [id number] - Electrical  stimulation (Manual), [id number] - Iontophoresis, [id number] - Ultrasound, [id number] - Physical performance training and [id number] - Aquatic therapy         Rhea Bleacher, PT, DPT 09/17/20 2:18 PM  [facility] [address] [place], [place], [address] Phone: [phone]   Fax:  [phone]  Name: Dwayne Green MRN: [id number] Date of Birth: [birth year]

## 2020-09-24 ENCOUNTER — Ambulatory Visit: Payer: Medicaid Other

## 2020-09-24 ENCOUNTER — Other Ambulatory Visit: Payer: Self-pay

## 2020-09-24 DIAGNOSIS — R293 Abnormal posture: Secondary | ICD-10-CM

## 2020-09-24 DIAGNOSIS — M62830 Muscle spasm of back: Secondary | ICD-10-CM

## 2020-09-24 DIAGNOSIS — M545 Low back pain, unspecified: Secondary | ICD-10-CM

## 2020-09-24 DIAGNOSIS — R2689 Other abnormalities of gait and mobility: Secondary | ICD-10-CM

## 2020-09-24 DIAGNOSIS — G8929 Other chronic pain: Secondary | ICD-10-CM

## 2020-09-24 NOTE — Therapy (Addendum)
[place], [place], [address] Phone: [phone]   Fax:  [phone]  Physical Therapy Treatment/Discharge  Patient Details  Name: Dwayne Green MRN: [id number] Date of Birth: 12-01-1995 Referring Provider (PT): Rosemarie Ax, MD   Encounter Date: 09/24/2020   PT End of Session - 09/24/20 1704    Visit Number 2    Number of Visits 9    Date for PT Re-Evaluation 11/15/20    Authorization Type [place] [employer] MCD    Authorization - Visit Number 2    Authorization - Number of Visits 27    PT Start Time 3704   pt arrived late   PT Stop Time 1744    PT Time Calculation (min) 40 min    Activity Tolerance Patient tolerated treatment well    Behavior During Therapy [facility] for tasks assessed/performed           History reviewed. No pertinent past medical history.  Past Surgical History:  Procedure Laterality Date  . TONSILLECTOMY AND ADENOIDECTOMY      There were no vitals filed for this visit.   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 He states that he was able to use lumbar support that was in the truck to help him maintain optimal posture while driving. He explains having some occasional stiffness but denies any significant pain.    Limitations Sitting;Standing;Walking;House hold activities    How long can you sit comfortably? 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 "Probably 30-45 minutes"    How long can you walk comfortably? "Probably 30-45 minutes" then sit for a break if I'm walking long distances    Diagnostic tests 07/29/2020 Lumbar spine radiograph: No acute osseous abnormality in the lumbar spine. Mild levocurvature of the thoracolumbar  junction    Patient Stated Goals "I want to just be back in the gym. I lost a lot of weight because the back limits me."    Currently in Pain? 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 Adult PT Treatment/Exercise - 09/24/20 0001      Self-Care   Self-Care Other Self-Care Comments    Other Self-Care Comments  See patient education      Lumbar Exercises: Stretches   Other Lumbar Stretch Exercise Doorknob rhomboid stretch x 45 sec      Lumbar Exercises: Aerobic   Nustep L9 x 5 min LE only      Lumbar Exercises: Standing   Other Standing Lumbar Exercises Hip hinge with dowel x 15      Lumbar Exercises: Supine   Dead Bug 15 reps    Dead Bug Limitations LE 90/90 tapping down x 5 each and alternating EXT x  10 each      Lumbar Exercises: Quadruped   Madcat/Old Horse 10 reps    Madcat/Old Horse Limitations 10x each direction    Other Quadruped Lumbar Exercises Bird dog LE only x 15 each LE - attempted alternating UE but decreased control and ability to maintain core activation at this time    Other Quadruped Lumbar Exercises Child's pose 10 x 5 sec      Manual Therapy   Manual Therapy Soft tissue mobilization;Myofascial release    Manual therapy comments Skilled inspection, palpation, and observation during TPDN of R mid/lower thoracic paraspinals by Carolyne Littles.    Soft tissue mobilization STM/MFR along R mid/lower thoracic paraspinals            Trigger Point Dry Needling - 09/24/20 0001    Consent Given? Yes    Education Handout Provided --   Verbal education provided   Muscles Treated Back/Hip Erector spinae    Dry Needling Comments Lower thoracic paraspinals    Erector  spinae Response Twitch response elicited                PT Education - 09/24/20 1951    Education Details Updated HEP, expected response following TPDN and to continue light stretching and activities as tolerated    Person(s) Educated Patient    Methods Explanation;Demonstration;Tactile cues;Verbal cues;Handout    Comprehension Verbalized understanding;Returned demonstration            PT Short Term Goals - 09/17/20 1407      PT SHORT TERM GOAL #1   Title Pt will be independent with initial HEP.    Baseline Provided initial HEP 09/16/2020.    Time 4    Period Weeks    Status New    Target Date 10/15/20      PT SHORT TERM GOAL #2   Title Pt will increase bilateral hip FL to at least 4/5 upon MMT reassessment.    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    Baseline Pt has not recently exercised in gym but wishes to return to workout routine and lifting without back pain    Time 4    Period Weeks    Status New    Target Date 10/15/20             PT Long Term Goals - 09/17/20 1412      PT LONG TERM GOAL #1   Title Pt will be independent with advanced HEP.    Baseline Pt provided initial HEP at evaluation 09/16/2020    Time 8    Period Weeks    Status New    Target Date 11/12/20      PT LONG TERM GOAL #2   Title Pt  will increase BLE MMT to 5/5 grossly    Baseline 4/5 grossly with 4-/5 B hip FL (see flowsheet)     Time 8    Period Weeks    Status New    Target Date 11/12/20      PT LONG TERM GOAL #3   Title Pt will report equal sensation with light touch reassessment along bilateral lateral femoral cutaneous nerve distribution/lateral thigh    Baseline Numbness and decreased sensation in lateral R thigh    Time 8    Period Weeks    Status New    Target Date 11/12/20      PT LONG TERM GOAL #4   Title 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 Continued with stretching and initiated core strengthening interventions following TPDN with good tolerance. He wishes to return to performing deadlifts, squats, and other activities at the gym but is hesitant due to his back pain - plan to assess body mechanics and progress tolerance. Pt should benefit from continued TPDN with potential electrical stimulation to address tightness in R thoracic paraspinals as well as continued core strengthening and progression towards return to lifting and desired gym activities. Pt denied increased pain or radicular symptoms this session with exception of symptoms as expected during TPDN.    Personal Factors and Comorbidities Profession;Past/Current Experience;Time since onset of injury/illness/exacerbation    Examination-Activity Limitations Sit;Sleep;Squat;Stairs;Stand;Locomotion Level;Lift;Bend;Carry    Examination-Participation Restrictions Cleaning;Community Activity;Driving;Meal Prep;Occupation;Shop    Stability/Clinical Decision Making Evolving/Moderate complexity    Rehab Potential  Good    PT Frequency 1x / week    PT Duration 8 weeks    PT Treatment/Interventions ADLs/Self Care Home Management;Aquatic Therapy;Electrical Stimulation;Cryotherapy;Iontophoresis 56m/ml Dexamethasone;Moist Heat;Traction;Neuromuscular re-education;Balance training;Therapeutic exercise;Therapeutic activities;Functional mobility training;Stair training;Gait training;Patient/family education;Manual techniques;Energy conservation;Dry needling;Passive range of motion;Taping    PT Next Visit Plan TPDN as indicated. Suggest pillow under L side when L sidelying to see if there's improvement in R LBP. Stretch and strengthen to address tightness in R paraspinals and QL from mild levocurvature, core/trunk/hip strengthening. Body mechanics and postural awareness. Deadlifts, squats/goblet squats.    PT HPine Flat LTR, child's pose, piriformis stretch    Consulted and Agree with Plan of Care Patient           Patient will benefit from skilled therapeutic intervention in order to improve the following deficits and impairments:  Decreased activity tolerance,Decreased strength,Decreased endurance,Decreased range of motion,Difficulty walking,Increased muscle spasms,Postural dysfunction,Improper body mechanics,Pain,Impaired sensation,Decreased mobility  Visit Diagnosis: Chronic right-sided low back pain, unspecified whether sciatica present  Muscle spasm of back  Abnormal posture  Other abnormalities of gait and mobility     Problem List Patient Active Problem List   Diagnosis Date Noted  . Chest pain of uncertain etiology 057/97/2820 . Acute right-sided low back pain without sciatica 07/29/2020  . 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  Patient goals were not met. Patient is being discharged due to not returning since the last visit.  ?????          Haydee Monica, PT, DPT 10/15/20 6:08 PM    [facility] [address] [place], [place], [address] Phone: [phone]   Fax:  [phone]  Name: Dwayne Green MRN: [id number] Date of Birth: 1995/09/25

## 2020-10-02 ENCOUNTER — Ambulatory Visit: Payer: Medicaid Other

## 2020-10-02 ENCOUNTER — Telehealth: Payer: Self-pay

## 2020-10-02 NOTE — Telephone Encounter (Signed)
Spoke c pt. He reports forgetting about today's appt since it was on a day he does not usually attend PT. Reminded pt of upcoming appt on 10/09/20.

## 2020-10-03 ENCOUNTER — Other Ambulatory Visit: Payer: Self-pay

## 2020-10-03 ENCOUNTER — Ambulatory Visit (HOSPITAL_COMMUNITY): Payer: Medicaid Other

## 2020-10-09 ENCOUNTER — Telehealth: Payer: Self-pay

## 2020-10-09 ENCOUNTER — Ambulatory Visit: Payer: Medicaid Other

## 2020-10-09 NOTE — Telephone Encounter (Signed)
Left voicemail regarding patient's 2nd no show and reiterated attendance policy. Informed patient that he would be able to schedule 1 appointment at a time if he comes in for next appointment. Confirmed next appointment date and time. Provided clinic phone number and requested that patient call to cancel/reschedule his next appointment.  Rhea Bleacher, PT, DPT 10/09/20 5:40 PM

## 2020-10-15 ENCOUNTER — Telehealth: Payer: Self-pay

## 2020-10-15 ENCOUNTER — Ambulatory Visit: Payer: Medicaid Other

## 2020-10-15 NOTE — Telephone Encounter (Signed)
PT called and left voicemail regarding patient's 3rd no show today and informed him that he will now be D/C per attendance policy. Informed pt that he will require new PT referral if he decides to return in the future.  Rhea Bleacher, PT, DPT 10/15/20 6:06 PM

## 2020-10-29 ENCOUNTER — Encounter (HOSPITAL_COMMUNITY): Payer: Self-pay

## 2020-10-29 ENCOUNTER — Encounter (HOSPITAL_COMMUNITY): Payer: Self-pay | Admitting: Internal Medicine

## 2020-10-29 ENCOUNTER — Other Ambulatory Visit (HOSPITAL_COMMUNITY): Payer: Medicaid Other

## 2020-11-12 ENCOUNTER — Telehealth (HOSPITAL_COMMUNITY): Payer: Self-pay | Admitting: Internal Medicine

## 2020-11-12 NOTE — Telephone Encounter (Signed)
Just an FYI. We have made several attempts to contact this patient including sending a letter to schedule or reschedule their echocardiogram. We will be removing the patient from the echo WQ.   10/29/20 NO SHOWED- MAILED LETTER [facility]    Thank you 

## 2020-12-25 ENCOUNTER — Other Ambulatory Visit: Payer: Self-pay

## 2020-12-25 ENCOUNTER — Telehealth: Payer: Medicaid Other | Admitting: Internal Medicine

## 2021-06-20 ENCOUNTER — Emergency Department (HOSPITAL_BASED_OUTPATIENT_CLINIC_OR_DEPARTMENT_OTHER)
Admission: EM | Admit: 2021-06-20 | Discharge: 2021-06-20 | Disposition: A | Discharge status: Home / Self Care | Payer: Medicaid Other | Attending: Emergency Medicine | Admitting: Emergency Medicine

## 2021-06-20 ENCOUNTER — Encounter (HOSPITAL_BASED_OUTPATIENT_CLINIC_OR_DEPARTMENT_OTHER): Payer: Self-pay | Admitting: Emergency Medicine

## 2021-06-20 ENCOUNTER — Other Ambulatory Visit: Payer: Self-pay

## 2021-06-20 DIAGNOSIS — Z20822 Contact with and (suspected) exposure to covid-19: Secondary | ICD-10-CM | POA: Insufficient documentation

## 2021-06-20 DIAGNOSIS — J101 Influenza due to other identified influenza virus with other respiratory manifestations: Secondary | ICD-10-CM | POA: Diagnosis not present

## 2021-06-20 DIAGNOSIS — R509 Fever, unspecified: Secondary | ICD-10-CM | POA: Diagnosis present

## 2021-06-20 LAB — RESP PANEL BY RT-PCR (FLU A&B, COVID) ARPGX2
Influenza A by PCR: POSITIVE — AB
Influenza B by PCR: NEGATIVE
SARS Coronavirus 2 by RT PCR: NEGATIVE

## 2021-06-20 NOTE — ED Triage Notes (Signed)
Pt presents to ED POV. Pt c/o cough, rhinorrhea, and fever since wed. Pt reports no ibuprofen since yesterday.

## 2021-06-20 NOTE — ED Notes (Signed)
Pt reports fever, cough, fatigue. Pt has tried Nyquil and theraflu without relief. Pt has not tried ibuprofen or tylenol for fever. Other household members with similar symptoms.

## 2021-06-20 NOTE — ED Provider Notes (Signed)
[facility] EMERGENCY DEPT Provider Note   CSN: [id number] Arrival date & time: 06/20/21  1923     History Chief Complaint  Patient presents with   Fever    Dwayne Green is a 25 y.o. male.  The history is provided by the patient. No language interpreter was used.  Fever Max temp prior to arrival:  101 Temp source:  Oral Severity:  Moderate Duration:  3 days Progression:  Worsening Chronicity:  New Relieved by:  Nothing Worsened by:  Nothing Associated symptoms: cough   Risk factors: sick contacts       History reviewed. No pertinent past medical history.  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 Authorizing Provider  cyclobenzaprine (FLEXERIL) 10 MG tablet Take 1 tablet (10 mg total) by mouth 2 (two) times daily as needed for muscle spasms. 07/27/20   Myra Rude, MD    Allergies    Patient has no known allergies.  Review of Systems   Review of Systems  Constitutional:  Positive for fever.  Respiratory:  Positive for cough.   All other systems reviewed and are negative.  Physical Exam Updated Vital Signs BP 138/76 (BP Location: Right Arm)   Pulse (!) 103   Temp 99.9 F (37.7 C) (Oral)   Resp 18   Ht 6\' 6"  (1.981 m)   Wt 84.8 kg   SpO2 99%   BMI 21.61 kg/m   Physical Exam Vitals and nursing note reviewed.   Constitutional:      Appearance: He is well-developed.  HENT:     Head: Normocephalic and atraumatic.  Eyes:     Conjunctiva/sclera: Conjunctivae normal.  Cardiovascular:     Rate and Rhythm: Normal rate and regular rhythm.     Heart sounds: No murmur heard. Pulmonary:     Effort: Pulmonary effort is normal. No respiratory distress.     Breath sounds: Normal breath sounds.  Abdominal:     Palpations: Abdomen is soft.     Tenderness: There is no abdominal tenderness.  Musculoskeletal:     Cervical back: Neck supple.  Skin:    General: Skin is warm and dry.  Neurological:     Mental Status: He is alert.    ED Results / Procedures / Treatments   Labs (all labs ordered are listed, but only abnormal results are displayed) Labs Reviewed  RESP PANEL BY RT-PCR (FLU A&B, COVID) ARPGX2 - Abnormal; Notable for the following components:      Result Value   Influenza A by PCR POSITIVE (*)    All other components within normal limits    EKG None  Radiology No results found.  Procedures Procedures   Medications Ordered in ED Medications - No data to display  ED Course  I  have reviewed the triage vital signs and the nursing notes.  Pertinent labs & imaging results that were available during my care of the patient were reviewed by me and considered in my medical decision making (see chart for details).    MDM Rules/Calculators/A&P                           MDM:  Influenza A positive.  Tylenol every 4 hours  Final Clinical Impression(s) / ED Diagnoses Final diagnoses:  Influenza A    Rx / DC Orders ED Discharge Orders     None     An After Visit Summary was printed and given to the patient.    Osie Cheeks 06/20/21 [id number]    Pricilla Loveless, MD 06/21/21 [phone]

## 2021-06-20 NOTE — Discharge Instructions (Signed)
Tylenol every 4 hours  

## 2022-11-18 IMAGING — DX DG LUMBAR SPINE 2-3V
3 series · 3 of 3 positions shown · non-contrast
Comparison: None.

CLINICAL DATA: Pain

EXAM:
LUMBAR SPINE - 2-3 VIEW

[l-spine ap]
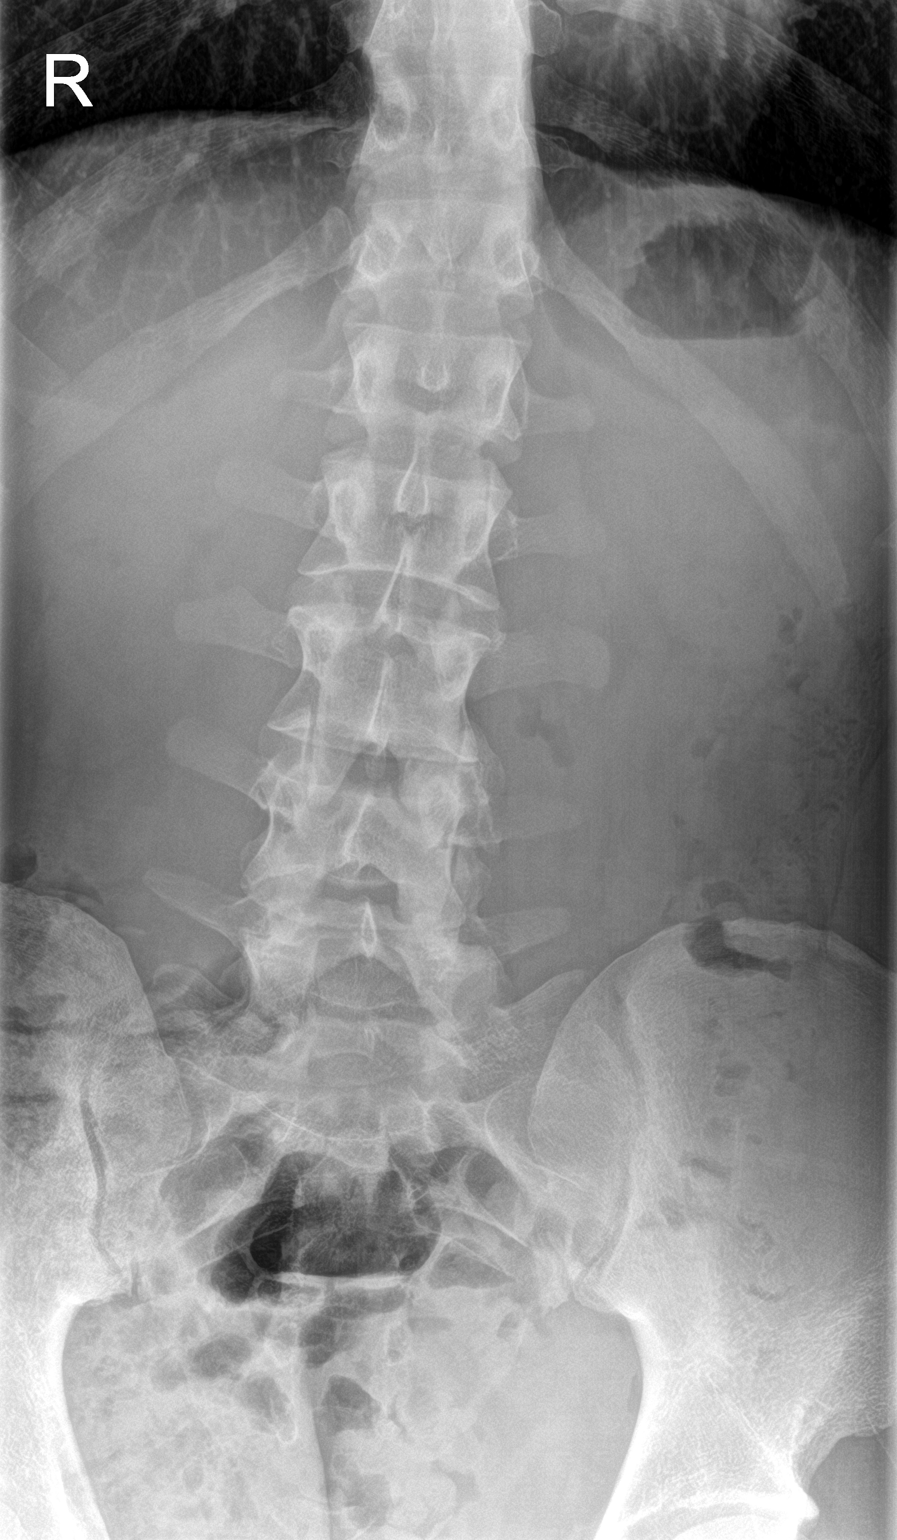

[l-spine lat]
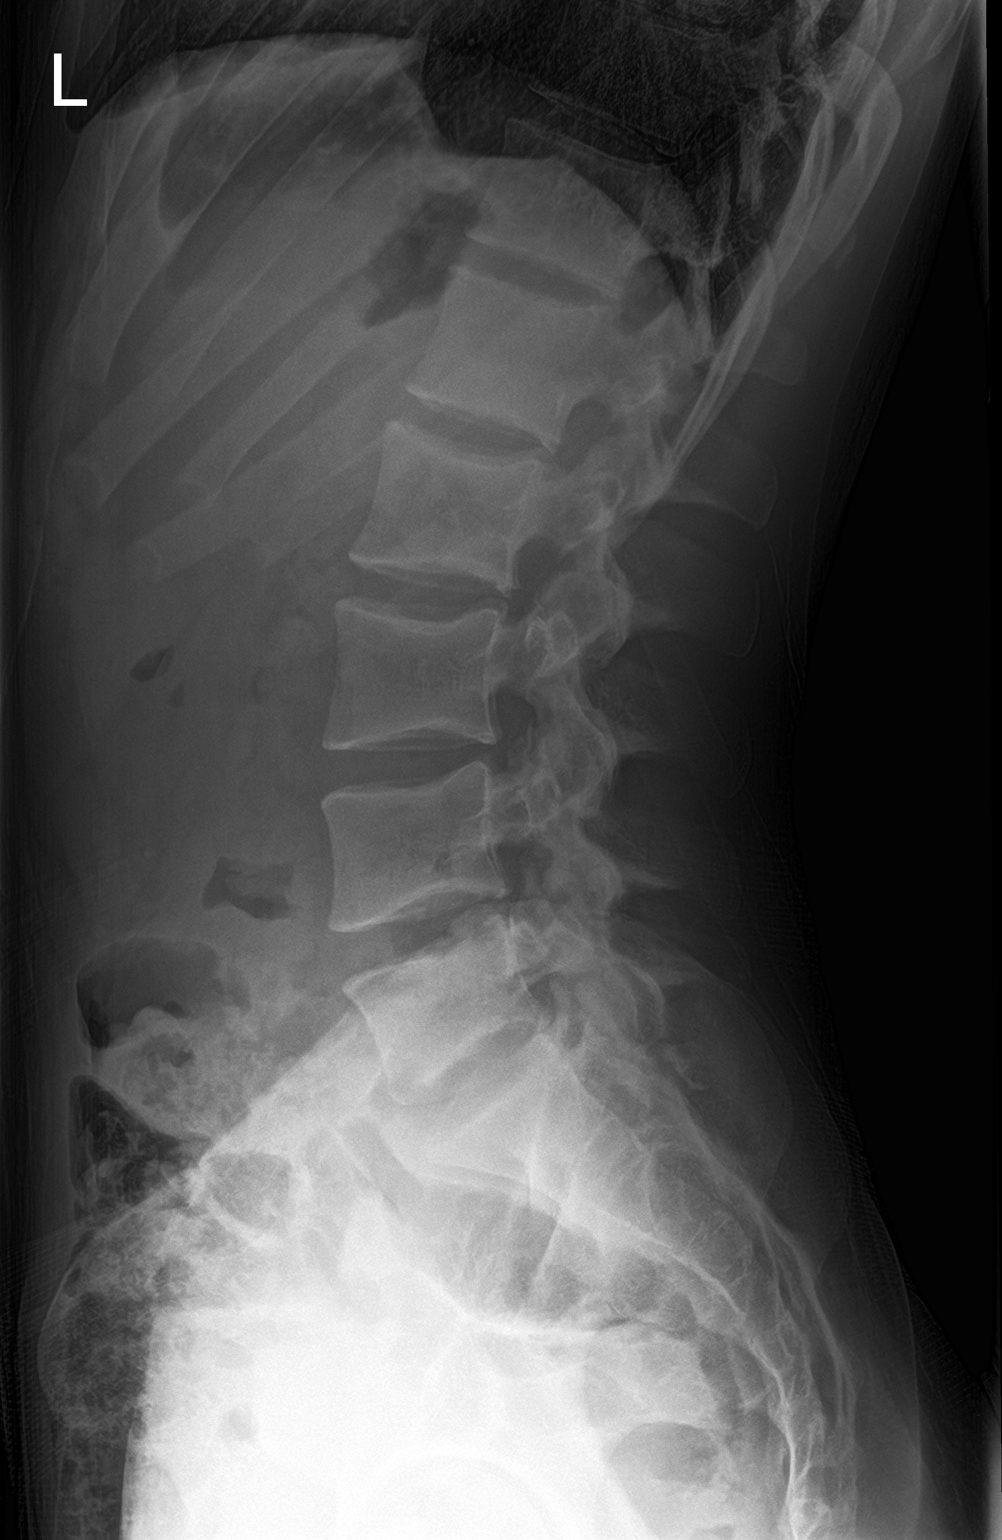

[l-spine spot]
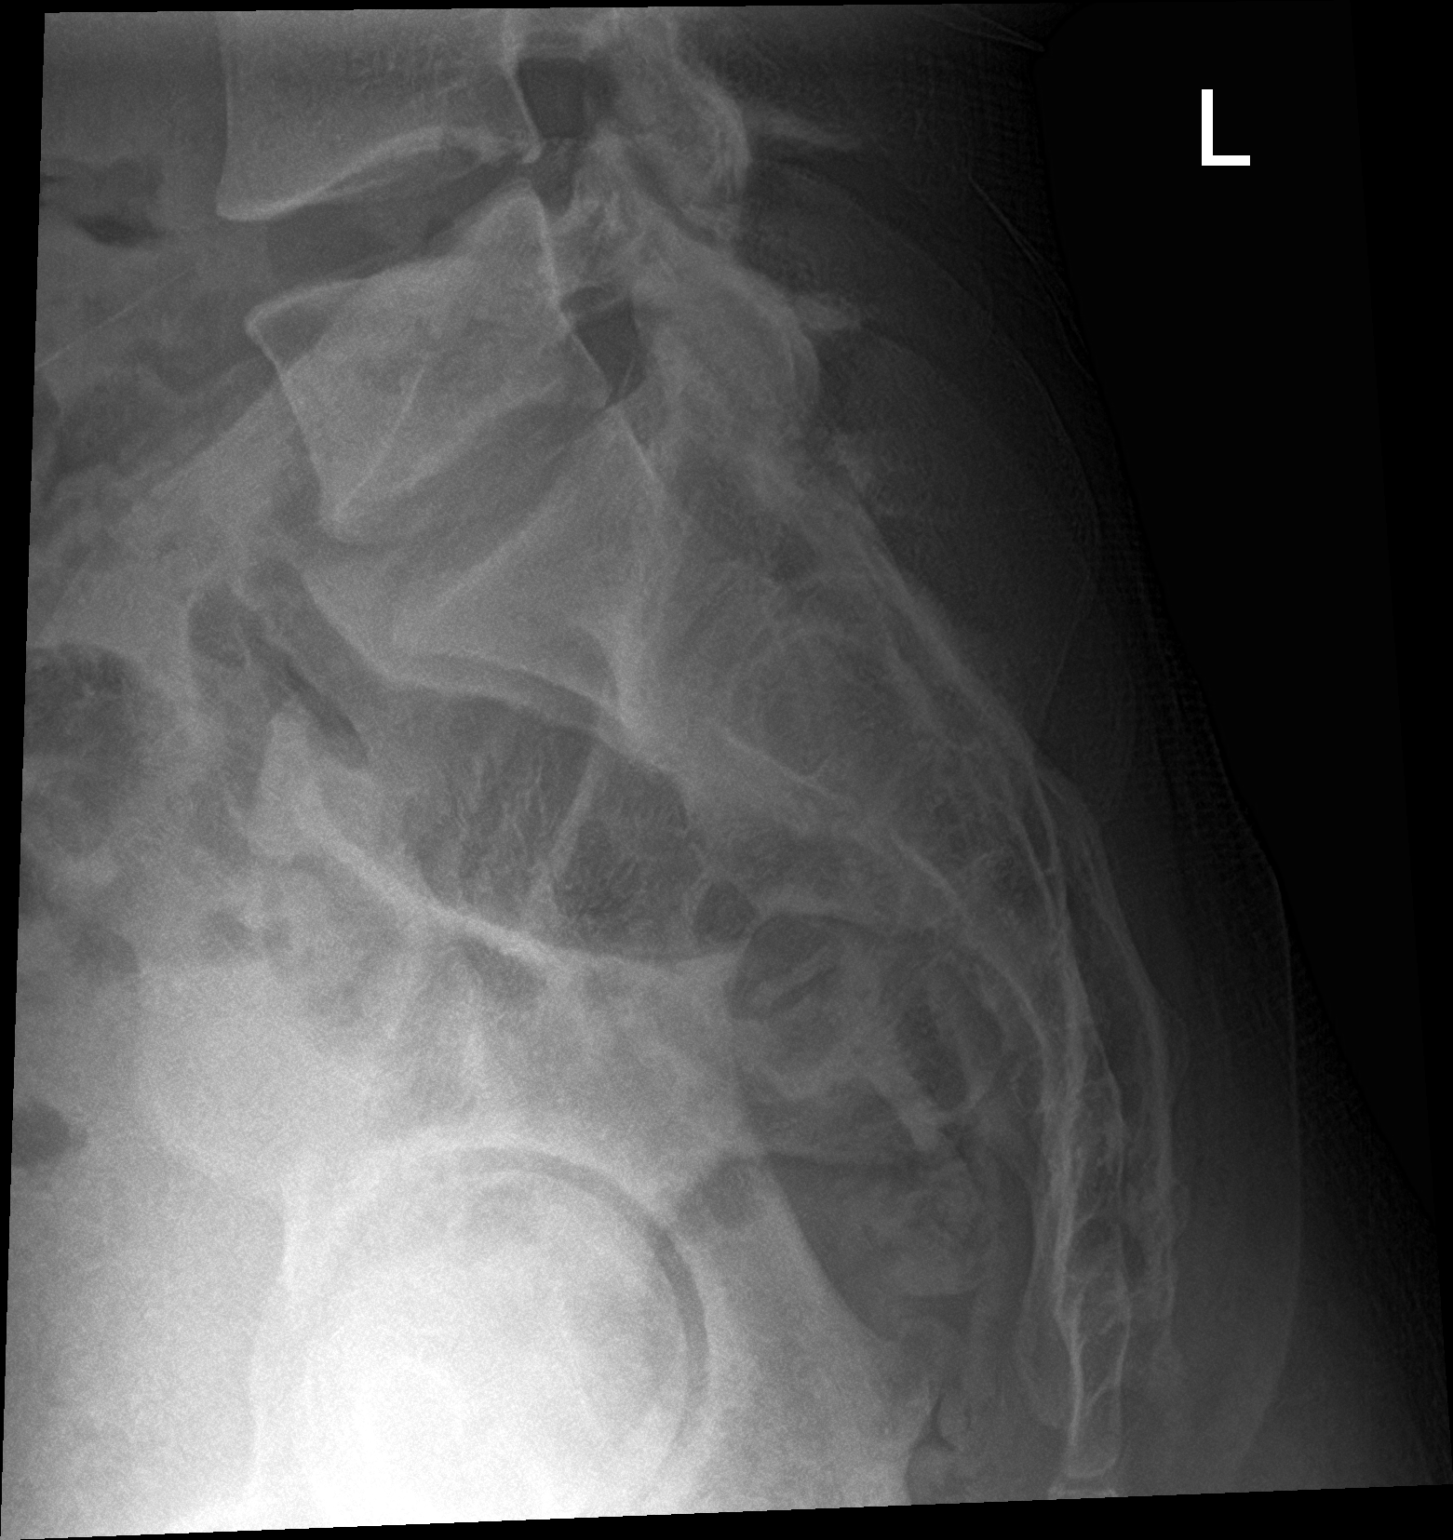

[3 of 3 positions shown; findings below may reference images not displayed]

FINDINGS: There are five non-rib bearing lumbar-type vertebral bodies. There
is mild levocurvature at the thoracolumbar junction. Minimal
retrolisthesis of L4-5. There is no evidence for acute fracture or
subluxation. Intervertebral disc spaces are preserved without
significant degenerative changes. Visualized abdomen is
unremarkable.
IMPRESSION: No acute osseous abnormality in the lumbar spine. Mild levocurvature
of the thoracolumbar junction

## 2022-11-25 ENCOUNTER — Encounter: Payer: Self-pay | Admitting: *Deleted
